# Patient Record
Sex: Male | Born: 1950 | Race: White | Hispanic: No | Marital: Married | Smoking: Never smoker
Health system: Southern US, Community
[De-identification: ages and names within clinical notes are randomized; demographics above are authoritative.]

## PROBLEM LIST (undated history)

## (undated) DIAGNOSIS — E7800 Pure hypercholesterolemia, unspecified: Secondary | ICD-10-CM

## (undated) DIAGNOSIS — R000 Tachycardia, unspecified: Secondary | ICD-10-CM

## (undated) DIAGNOSIS — C61 Malignant neoplasm of prostate: Secondary | ICD-10-CM

## (undated) DIAGNOSIS — C189 Malignant neoplasm of colon, unspecified: Secondary | ICD-10-CM

## (undated) DIAGNOSIS — I10 Essential (primary) hypertension: Secondary | ICD-10-CM

## (undated) HISTORY — PX: PROSTATE BIOPSY: SHX241

## (undated) HISTORY — PX: COLON RESECTION: SHX5231

---

## 1994-03-26 HISTORY — PX: BACK SURGERY: SHX140

## 2019-01-27 ENCOUNTER — Other Ambulatory Visit: Payer: Self-pay | Admitting: Urology

## 2019-01-27 DIAGNOSIS — C61 Malignant neoplasm of prostate: Secondary | ICD-10-CM

## 2019-02-11 ENCOUNTER — Encounter (HOSPITAL_COMMUNITY)
Admission: RE | Admit: 2019-02-11 | Discharge: 2019-02-11 | Disposition: A | Discharge status: Home / Self Care | Payer: Medicare Other | Source: Ambulatory Visit | Attending: Urology | Admitting: Urology

## 2019-02-11 ENCOUNTER — Other Ambulatory Visit: Payer: Self-pay

## 2019-02-11 DIAGNOSIS — C61 Malignant neoplasm of prostate: Secondary | ICD-10-CM | POA: Insufficient documentation

## 2019-02-11 MED ORDER — TECHNETIUM TC 99M MEDRONATE IV KIT
21.5000 | PACK | Freq: Once | INTRAVENOUS | Status: AC | PRN
  Administered 2019-02-11: 21.5 via INTRAVENOUS

## 2019-03-09 ENCOUNTER — Encounter: Payer: Self-pay | Admitting: *Deleted

## 2019-03-23 NOTE — Progress Notes (Signed)
GU Location of Tumor / Histology: prostatic adenocarcinoma  If Prostate Cancer, Gleason Score is (4 + 4) and PSA is (5.6). Prostate volume: 25.1 cc  Corey Carroll reports presenting to his PCP in May 2020. Reports an elevated PSA of 4.5 was discovered and he was referred to a urologist in [facility]. Patient wasn't pleased with the [facility] urologist and transferred his care to Dr. Junious Silk who did a prostate biopsy. Then, patient spoke with Dr. Alinda Money about surgical intervention.  Biopsies of prostate (if applicable) revealed:    Past/Anticipated interventions by urology, if any: prostate biopsy, CT abd/pelvis (negative), Bone scan (negative), referral for consideration of radiotherapy.  Patient leaning toward surgery. Patient with hx of colon ca. Patient had resection and no further treatment in 2003.   Past/Anticipated interventions by medical oncology, if any: no  Weight changes, if any: Denies. Reports consuming a plant based diet for years.   Bowel/Bladder complaints, if any: IPSS 2. SHIM 24 with medication. Denies dysuria,hematuria, urinary leakage or incontinence. Denies any bowel complaints.    Nausea/Vomiting, if any: denies  Pain issues, if any:  denies  SAFETY ISSUES:  Prior radiation? Denies radiation for treatment. Reports in 2004 he received dye in his system to examine his heart.  Pacemaker/ICD? denies  Possible current pregnancy? no, male patient  Is the patient on methotrexate? denies  Current Complaints / other details:  68 year old male. Married. No children.   Mother with history of breast ca. Father with hx of lung ca.

## 2019-03-24 ENCOUNTER — Other Ambulatory Visit: Payer: Self-pay

## 2019-03-24 ENCOUNTER — Ambulatory Visit
Admission: RE | Admit: 2019-03-24 | Discharge: 2019-03-24 | Disposition: A | Discharge status: Home / Self Care | Payer: Medicare Other | Source: Ambulatory Visit | Attending: Radiation Oncology | Admitting: Radiation Oncology

## 2019-03-24 ENCOUNTER — Encounter: Payer: Self-pay | Admitting: Radiation Oncology

## 2019-03-24 VITALS — Ht 69.0 in | Wt 138.0 lb

## 2019-03-24 DIAGNOSIS — C61 Malignant neoplasm of prostate: Secondary | ICD-10-CM

## 2019-03-24 HISTORY — DX: Tachycardia, unspecified: R00.0

## 2019-03-24 HISTORY — DX: Malignant neoplasm of prostate: C61

## 2019-03-24 HISTORY — DX: Malignant neoplasm of colon, unspecified: C18.9

## 2019-03-24 NOTE — Progress Notes (Signed)
See progress notes under physician encounter. 

## 2019-03-24 NOTE — Progress Notes (Signed)
Radiation Oncology         [phone] ________________________________  Initial outpatient Consultation - Conducted via Telephone due to current COVID-19 concerns for limiting patient exposure  Name: Corey Carroll MRN: [id number]  Date: 03/24/2019  DOB: 03-24-1951  [id number], Terisa Starr, MD   REFERRING PHYSICIAN: Raynelle Bring, MD  DIAGNOSIS: 68 y.o. gentleman with Stage T1c adenocarcinoma of the prostate with Gleason score of 4+4, and PSA of 5.87.    ICD-10-CM   1. Malignant neoplasm of prostate ([place])  C61     HISTORY OF PRESENT ILLNESS: Corey Carroll is a 68 y.o. male with a diagnosis of prostate cancer. He was noted to have an elevated PSA of 4.5 by his primary care provider, Shella Spearing, PA-C. Per patient, he was initially referred to a urologist in [facility] in 07/2018, but he opted to transfer his care elsewhere.  In the meantime, a repeat PSA performed by his PCP in 10/2018 had risen slightly to 5.6.  Accordingly, he was referred for evaluation in urology by Dr. Junious Silk on 12/26/2018,  digital rectal examination was performed at that time revealing no nodules. Repeat PSA performed at that time remained elevated at 5.87.  The patient proceeded to transrectal ultrasound with 12 biopsies of the prostate on 01/21/2019.  The prostate volume measured 25.1 cc.  Out of 12 core biopsies, 3 were positive.  The maximum Gleason score was 4+4, and this was seen in right apex and right lateral base. Gleason 3+4 was seen in right lateral apex.  He underwent CT A/P and bone scan for disease staging on 02/11/2019, both of which showed no evidence for metastatic disease.  The patient reviewed the biopsy results with his urologist and he has kindly been referred today for discussion of potential radiation treatment options. He also met with Dr. Alinda Money on 03/03/2019 to discuss surgical options.  Of note, he has a history of colon cancer, s/p resection in 2003.    PREVIOUS RADIATION THERAPY: No  PAST MEDICAL HISTORY:  Past Medical History:  Diagnosis Date   Colon cancer ([place])    Prostate cancer ([place])    Tachycardia       PAST SURGICAL HISTORY: Past Surgical History:  Procedure Laterality Date   BACK SURGERY  1996   removed herniated disc at L5   COLON RESECTION     PROSTATE BIOPSY      FAMILY HISTORY:  Family History  Problem Relation Age of Onset   Breast cancer Mother        tx by way of mastectomy   Lung cancer Father        smoker but stopped at 16 and died at 28 of lung ca.   Colon cancer Paternal Aunt    Colon cancer Paternal Aunt     SOCIAL HISTORY:  Social History   Socioeconomic History   Marital status: Married    Spouse name: Not on file   Number of children: 0   Years of education: Not on file   Highest education level: Not on file  Occupational History    Comment: retired  Tobacco Use   Smoking status: Never Smoker   Smokeless tobacco: Never Used  Substance and Sexual Activity   Alcohol use: Yes    Alcohol/week: 4.0 standard drinks    Types: 3 Glasses of wine, 1 Cans of beer per week    Comment: red wine with dinner   Drug use: Never   Sexual activity: Yes  Other  Topics Concern   Not on file  Social History Narrative   Not on file   Social Determinants of Health   Financial Resource Strain:    Difficulty of Paying Living Expenses: Not on file  Food Insecurity:    Worried About [place] in the Last Year: Not on file   Ran Out of Food in the Last Year: Not on file  Transportation Needs:    Lack of Transportation (Medical): Not on file   Lack of Transportation (Non-Medical): Not on file  Physical Activity:    Days of Exercise per Week: Not on file   Minutes of Exercise per Session: Not on file  Stress:    Feeling of Stress : Not on file  Social Connections:    Frequency of Communication with Friends and Family: Not on file   Frequency of Social  Gatherings with Friends and Family: Not on file   Attends Religious Services: Not on file   Active Member of Clubs or Organizations: Not on file   Attends Archivist Meetings: Not on file   Marital Status: Not on file  Intimate Partner Violence:    Fear of Current or Ex-Partner: Not on file   Emotionally Abused: Not on file   Physically Abused: Not on file   Sexually Abused: Not on file    ALLERGIES: Patient has no known allergies.  MEDICATIONS:  Current Outpatient Medications  Medication Sig Dispense Refill   carvedilol (COREG) 6.25 MG tablet SMARTSIG:1 Tablet(s) By Mouth As Needed     Coenzyme Q10 (COQ10) 100 MG CAPS Take by mouth.     lisinopril (ZESTRIL) 5 MG tablet Take 5 mg by mouth daily.     Melatonin 3 MG TABS Take by mouth.     Multiple Vitamins-Minerals (CENTRUM ADULTS PO) Take by mouth.     simvastatin (ZOCOR) 20 MG tablet Take 20 mg by mouth daily. Every other day     sildenafil (REVATIO) 20 MG tablet SMARTSIG:1 Tablet(s) By Mouth As Needed     No current facility-administered medications for this encounter.    REVIEW OF SYSTEMS:  On review of systems, the patient reports that he is doing well overall. He denies any chest pain, shortness of breath, cough, fevers, chills, night sweats, unintended weight changes. He denies any bowel disturbances, and denies abdominal pain, nausea or vomiting. He denies any new musculoskeletal or joint aches or pains. His IPSS was 2, indicating very mild urinary symptoms. His SHIM was 24 with medication, indicating he has controlled erectile dysfunction. A complete review of systems is obtained and is otherwise negative.  PHYSICAL EXAM:  Wt Readings from Last 3 Encounters:  03/24/19 138 lb (62.6 kg)   Temp Readings from Last 3 Encounters:  No data found for Temp   BP Readings from Last 3 Encounters:  No data found for BP   Pulse Readings from Last 3 Encounters:  No data found for Pulse   Pain  Assessment Pain Score: 0-No pain/10  Physical exam not performed in light of telephone consult visit format.    KPS = 100  100 - Normal; no complaints; no evidence of disease. 90   - Able to carry on normal activity; minor signs or symptoms of disease. 80   - Normal activity with effort; some signs or symptoms of disease. 37   - Cares for self; unable to carry on normal activity or to do active work. 60   - Requires occasional assistance, but is  able to care for most of his personal needs. 50   - Requires considerable assistance and frequent medical care. 42   - Disabled; requires special care and assistance. 5   - Severely disabled; hospital admission is indicated although death not imminent. 35   - Very sick; hospital admission necessary; active supportive treatment necessary. 10   - Moribund; fatal processes progressing rapidly. 0     - Dead  Karnofsky DA, Abelmann WH, Craver LS and Burchenal JH [phone]) The use of the nitrogen mustards in the palliative treatment of carcinoma: with particular reference to bronchogenic carcinoma Cancer 1 634-56  LABORATORY DATA:  No results found for: WBC, HGB, HCT, MCV, PLT No results found for: NA, K, CL, CO2 No results found for: ALT, AST, GGT, ALKPHOS, BILITOT   RADIOGRAPHY: No results found.    IMPRESSION/PLAN: This visit was conducted via Telephone to spare the patient unnecessary potential exposure in the healthcare setting during the current COVID-19 pandemic. 1. 68 y.o. gentleman with Stage T1c adenocarcinoma of the prostate with Gleason Score of 4+4, and PSA of 5.87. We discussed the patient's workup and outlined the nature of prostate cancer in this setting. The patient's T stage, Gleason's score, and PSA put him into the high risk group. Accordingly, he is eligible for a variety of potential treatment options including LT-ADT concurrent with either 8 weeks of external radiation or 5 weeks of external radiation followed by a brachytherapy  boost. We discussed the available radiation techniques, and focused on the details and logistics of delivery. We discussed and outlined the risks, benefits, short and long-term effects associated with radiotherapy and compared and contrasted these with prostatectomy. We discussed the role of SpaceOAR in reducing the rectal toxicity associated with radiotherapy. We also detailed the role of ADT in the treatment of high risk prostate cancer and outlined the associated side effects that could be expected with this therapy. We explained the rationale behind intentionally delaying the start of radiation for approximately 2 months after starting ADT to allow for the radiosensitizing effects of this therapy.  He and his wife were encouraged to ask questions which were answered to their stated satisfaction.  At the end of the conversation the patient elects to proceed with LT-ADT concurrent with a brachytherapy seed boost with SpaceOAR gel placement followed by 5 weeks of external beam radiotherapy. We will share our discussion with Dr. Junious Silk and Dr. Alinda Money and make arrangements for a follow-up visit to start ADT, first available in anticipation of proceeding with the seed boost procedure in March 2021, approximately 2 months after starting ADT. The patient will be contacted by Romie Jumper in our office who will be working closely with him to coordinate OR scheduling and pre and post procedure appointments.  We will contact the pharmaceutical rep to ensure that [place] is available at the time of procedure.  He will have a prostate MRI following his post-seed CT SIM to confirm appropriate distribution of the SpaceOAR and we will begin treatment planning for the external beam radiotherapy at that time, in anticipation of beginning the daily radiation treatments approximately 3 weeks after his seed boost procedure.  He appears to have a good understanding of his disease and our treatment recommendations which are of  curative intent and is comfortable and in agreement with the stated plan.  We will move forward with treatment planning accordingly..  Given current concerns for patient exposure during the COVID-19 pandemic, this encounter was conducted via telephone. The patient was  notified in advance and was offered a MyChart meeting to allow for face to face communication but unfortunately reported that he did not have the appropriate resources/technology to support such a visit and instead preferred to proceed with telephone consult. The patient has given verbal consent for this type of encounter. The time spent during this encounter was 60 minutes. The attendants for this meeting include Tyler Pita MD, Ashlyn Bruning PA-C, [place], patient, Kelsen Celona and his wife. During the encounter, Tyler Pita MD, Ashlyn Bruning PA-C, and scribe, Wilburn Mylar were located at [place].  Patient, Zanden Colver and his wife were located at home.    Nicholos Johns, PA-C    Tyler Pita, MD  [facility] Direct Dial: [phone]   Fax: [phone] [email or web address]   Skype   [email or web address]   This document serves as a record of services personally performed by Tyler Pita, MD and Freeman Caldron, PA-C. It was created on their behalf by Wilburn Mylar, a trained medical scribe. The creation of this record is based on the scribe's personal observations and the provider's statements to them. This document has been checked and approved by the attending provider.

## 2019-03-25 DIAGNOSIS — C61 Malignant neoplasm of prostate: Secondary | ICD-10-CM | POA: Insufficient documentation

## 2019-03-31 ENCOUNTER — Telehealth: Payer: Self-pay | Admitting: *Deleted

## 2019-03-31 NOTE — Telephone Encounter (Signed)
CALLED PATIENT TO INFORM OF ADT APPT. FOR 04-27-19 - ARRIVAL TIME- 1:45 PM @ DR. Lyndal Rainbow OFFICE, SPOKE WITH PATIENT AND HE IS AWARE OF THIS APPT.

## 2019-04-10 ENCOUNTER — Telehealth: Payer: Self-pay | Admitting: *Deleted

## 2019-04-10 NOTE — Telephone Encounter (Signed)
Called patient to inform of ADT appt. for 04-13-19 - arrival time- 10:15 am @ Dr. Lyndal Rainbow Office, spoke with patient and he is aware of this appt.

## 2019-04-16 ENCOUNTER — Other Ambulatory Visit: Payer: Self-pay | Admitting: Urology

## 2019-04-22 ENCOUNTER — Telehealth: Payer: Self-pay | Admitting: *Deleted

## 2019-04-22 NOTE — Telephone Encounter (Signed)
Called patient to inform of pre-seed planning CT and implant , spoke with patient and he is aware of these appts.

## 2019-06-01 ENCOUNTER — Telehealth: Payer: Self-pay | Admitting: *Deleted

## 2019-06-01 NOTE — Telephone Encounter (Signed)
RETURNED PATIENT'S PHONE CALL, LVM FOR A RETURN CALL 

## 2019-06-01 NOTE — Progress Notes (Signed)
  Radiation Oncology         [phone] ________________________________  Name: Corey Carroll MRN: [id number]  Date: 06/04/2019  DOB: [birth year]  SIMULATION AND TREATMENT PLANNING NOTE PUBIC ARCH STUDY  [name], Terisa Starr, MD  DIAGNOSIS: 69 y.o. gentleman with Stage T1c adenocarcinoma of the prostate with Gleason score of 4+4, and PSA of 5.87     ICD-10-CM   1. Malignant neoplasm of prostate ([place])  C61     COMPLEX SIMULATION:  The patient presented today for evaluation for possible prostate seed implant. He was brought to the radiation planning suite and placed supine on the CT couch. A 3-dimensional image study set was obtained in upload to the planning computer. There, on each axial slice, I contoured the prostate gland. Then, using three-dimensional radiation planning tools I reconstructed the prostate in view of the structures from the transperineal needle pathway to assess for possible pubic arch interference. In doing so, I did not appreciate any pubic arch interference. Also, the patient's prostate volume was estimated based on the drawn structure. The volume was 25 cc.  Given the pubic arch appearance and prostate volume, patient remains a good candidate to proceed with prostate seed implant. Today, he freely provided informed written consent to proceed.    PLAN: The patient will undergo prostate seed implant boost to be followed by external radiotherapy.   ________________________________  Sheral Apley. Tammi Klippel, M.D.

## 2019-06-03 ENCOUNTER — Telehealth: Payer: Self-pay | Admitting: *Deleted

## 2019-06-03 NOTE — Telephone Encounter (Signed)
CALLED PATIENT TO REMIND OF PRE-SEED APPTS. FOR 06-04-19, LVM FOR A RETURN CALL

## 2019-06-04 ENCOUNTER — Other Ambulatory Visit: Payer: Self-pay

## 2019-06-04 ENCOUNTER — Ambulatory Visit
Admission: RE | Admit: 2019-06-04 | Discharge: 2019-06-04 | Disposition: A | Discharge status: Home / Self Care | Payer: Medicare Other | Source: Ambulatory Visit | Attending: Radiation Oncology | Admitting: Radiation Oncology

## 2019-06-04 ENCOUNTER — Encounter (HOSPITAL_COMMUNITY)
Admission: RE | Admit: 2019-06-04 | Discharge: 2019-06-04 | Disposition: A | Discharge status: Home / Self Care | Payer: Medicare Other | Source: Ambulatory Visit | Attending: Urology | Admitting: Urology

## 2019-06-04 ENCOUNTER — Encounter (HOSPITAL_COMMUNITY): Payer: Medicare Other

## 2019-06-04 ENCOUNTER — Ambulatory Visit
Admission: RE | Admit: 2019-06-04 | Discharge: 2019-06-04 | Disposition: A | Discharge status: Home / Self Care | Payer: Medicare Other | Source: Ambulatory Visit | Attending: Urology | Admitting: Urology

## 2019-06-04 ENCOUNTER — Ambulatory Visit (HOSPITAL_COMMUNITY)
Admission: RE | Admit: 2019-06-04 | Discharge: 2019-06-04 | Disposition: A | Discharge status: Home / Self Care | Payer: Medicare Other | Source: Ambulatory Visit | Attending: Urology | Admitting: Urology

## 2019-06-04 ENCOUNTER — Other Ambulatory Visit: Payer: Self-pay | Admitting: Urology

## 2019-06-04 DIAGNOSIS — C61 Malignant neoplasm of prostate: Secondary | ICD-10-CM | POA: Diagnosis present

## 2019-06-25 ENCOUNTER — Encounter (HOSPITAL_BASED_OUTPATIENT_CLINIC_OR_DEPARTMENT_OTHER): Payer: Self-pay | Admitting: Urology

## 2019-06-25 ENCOUNTER — Other Ambulatory Visit: Payer: Self-pay

## 2019-06-25 ENCOUNTER — Telehealth: Payer: Self-pay | Admitting: *Deleted

## 2019-06-25 NOTE — Telephone Encounter (Signed)
CALLED PATIENT TO REMIND OF LAB AND COVID TESTING FOR 06-30-19, SPOKE WITH PATIENT AND HE IS AWARE OF THESE APPTS.

## 2019-06-25 NOTE — Progress Notes (Signed)
Spoke with patient via telephone for pre op interview. NPO after MN. No medications am of surgery. Patient verbalized understanding of using FLEETS enema AM of surgery. Arrival time 0830.

## 2019-06-30 ENCOUNTER — Encounter (HOSPITAL_COMMUNITY)
Admission: RE | Admit: 2019-06-30 | Discharge: 2019-06-30 | Disposition: A | Discharge status: Home / Self Care | Payer: Medicare Other | Source: Ambulatory Visit | Attending: Urology | Admitting: Urology

## 2019-06-30 ENCOUNTER — Other Ambulatory Visit (HOSPITAL_COMMUNITY)
Admission: RE | Admit: 2019-06-30 | Discharge: 2019-06-30 | Disposition: A | Discharge status: Home / Self Care | Payer: Medicare Other | Source: Ambulatory Visit | Attending: Urology | Admitting: Urology

## 2019-06-30 ENCOUNTER — Other Ambulatory Visit: Payer: Self-pay

## 2019-06-30 DIAGNOSIS — E7800 Pure hypercholesterolemia, unspecified: Secondary | ICD-10-CM | POA: Diagnosis not present

## 2019-06-30 DIAGNOSIS — Z85038 Personal history of other malignant neoplasm of large intestine: Secondary | ICD-10-CM | POA: Diagnosis not present

## 2019-06-30 DIAGNOSIS — Z01812 Encounter for preprocedural laboratory examination: Secondary | ICD-10-CM | POA: Diagnosis present

## 2019-06-30 DIAGNOSIS — C61 Malignant neoplasm of prostate: Secondary | ICD-10-CM | POA: Diagnosis not present

## 2019-06-30 DIAGNOSIS — I10 Essential (primary) hypertension: Secondary | ICD-10-CM | POA: Diagnosis not present

## 2019-06-30 DIAGNOSIS — Z79899 Other long term (current) drug therapy: Secondary | ICD-10-CM | POA: Diagnosis not present

## 2019-06-30 DIAGNOSIS — Z20822 Contact with and (suspected) exposure to covid-19: Secondary | ICD-10-CM | POA: Diagnosis not present

## 2019-06-30 LAB — COMPREHENSIVE METABOLIC PANEL
ALT: 28 U/L (ref 0–44)
AST: 28 U/L (ref 15–41)
Albumin: 4.2 g/dL (ref 3.5–5.0)
Alkaline Phosphatase: 48 U/L (ref 38–126)
Anion gap: 7 (ref 5–15)
BUN: 17 mg/dL (ref 8–23)
CO2: 31 mmol/L (ref 22–32)
Calcium: 9.8 mg/dL (ref 8.9–10.3)
Chloride: 105 mmol/L (ref 98–111)
Creatinine, Ser: 0.71 mg/dL (ref 0.61–1.24)
GFR calc Af Amer: >60 mL/min (ref >60)
GFR calc non Af Amer: >60 mL/min (ref >60)
Glucose, Bld: 97 mg/dL (ref 70–99)
Potassium: 4.7 mmol/L (ref 3.5–5.1)
Sodium: 143 mmol/L (ref 135–145)
Total Bilirubin: 0.6 mg/dL (ref 0.3–1.2)
Total Protein: 7.6 g/dL (ref 6.5–8.1)

## 2019-06-30 LAB — PROTIME-INR
INR: 0.9 (ref 0.8–1.2)
Prothrombin Time: 12.2 seconds (ref 11.4–15.2)

## 2019-06-30 LAB — CBC
HCT: 41.5 % (ref 39.0–52.0)
Hemoglobin: 13.6 g/dL (ref 13.0–17.0)
MCH: 31.6 pg (ref 26.0–34.0)
MCHC: 32.8 g/dL (ref 30.0–36.0)
MCV: 96.3 fL (ref 80.0–100.0)
Platelets: 179 10*3/uL (ref 150–400)
RBC: 4.31 MIL/uL (ref 4.22–5.81)
RDW: 11.8 % (ref 11.5–15.5)
WBC: 4.1 10*3/uL (ref 4.0–10.5)
nRBC: 0 % (ref 0.0–0.2)

## 2019-06-30 LAB — SARS CORONAVIRUS 2 (TAT 6-24 HRS): SARS Coronavirus 2: NEGATIVE

## 2019-06-30 LAB — APTT: aPTT: 31 seconds (ref 24–36)

## 2019-07-02 ENCOUNTER — Telehealth: Payer: Self-pay | Admitting: *Deleted

## 2019-07-02 NOTE — Telephone Encounter (Signed)
CALLED PATIENT TO REMIND OF IMPLANT FOR 07-03-19, SPOKE WITH PATIENT AND HE IS AWARE OF THIS PROCEDURE

## 2019-07-03 ENCOUNTER — Ambulatory Visit (HOSPITAL_BASED_OUTPATIENT_CLINIC_OR_DEPARTMENT_OTHER)
Admission: RE | Admit: 2019-07-03 | Discharge: 2019-07-03 | Disposition: A | Discharge status: Home / Self Care | Payer: Medicare Other | Attending: Urology | Admitting: Urology

## 2019-07-03 ENCOUNTER — Ambulatory Visit (HOSPITAL_COMMUNITY): Payer: Medicare Other

## 2019-07-03 ENCOUNTER — Ambulatory Visit (HOSPITAL_BASED_OUTPATIENT_CLINIC_OR_DEPARTMENT_OTHER): Payer: Medicare Other | Admitting: Anesthesiology

## 2019-07-03 ENCOUNTER — Encounter (HOSPITAL_BASED_OUTPATIENT_CLINIC_OR_DEPARTMENT_OTHER): Payer: Self-pay | Admitting: Urology

## 2019-07-03 ENCOUNTER — Ambulatory Visit (HOSPITAL_BASED_OUTPATIENT_CLINIC_OR_DEPARTMENT_OTHER): Payer: Medicare Other | Admitting: Physician Assistant

## 2019-07-03 ENCOUNTER — Other Ambulatory Visit: Payer: Self-pay

## 2019-07-03 ENCOUNTER — Encounter (HOSPITAL_BASED_OUTPATIENT_CLINIC_OR_DEPARTMENT_OTHER)
Admission: RE | Disposition: A | Discharge status: Home / Self Care | Payer: Self-pay | Source: Home / Self Care | Attending: Urology

## 2019-07-03 DIAGNOSIS — Z79899 Other long term (current) drug therapy: Secondary | ICD-10-CM | POA: Diagnosis not present

## 2019-07-03 DIAGNOSIS — I10 Essential (primary) hypertension: Secondary | ICD-10-CM | POA: Diagnosis not present

## 2019-07-03 DIAGNOSIS — E7800 Pure hypercholesterolemia, unspecified: Secondary | ICD-10-CM | POA: Insufficient documentation

## 2019-07-03 DIAGNOSIS — Z85038 Personal history of other malignant neoplasm of large intestine: Secondary | ICD-10-CM | POA: Insufficient documentation

## 2019-07-03 DIAGNOSIS — C61 Malignant neoplasm of prostate: Secondary | ICD-10-CM | POA: Insufficient documentation

## 2019-07-03 HISTORY — PX: RADIOACTIVE SEED IMPLANT: SHX5150

## 2019-07-03 HISTORY — PX: CYSTOSCOPY: SHX5120

## 2019-07-03 HISTORY — DX: Pure hypercholesterolemia, unspecified: E78.00

## 2019-07-03 HISTORY — PX: SPACE OAR INSTILLATION: SHX6769

## 2019-07-03 HISTORY — DX: Essential (primary) hypertension: I10

## 2019-07-03 SURGERY — INSERTION, RADIATION SOURCE, PROSTATE
Anesthesia: General | Site: Rectum

## 2019-07-03 MED ORDER — OXYCODONE HCL 5 MG/5ML PO SOLN
5.0000 mg | Freq: Once | ORAL | Status: DC | PRN
  Filled 2019-07-03: qty 5

## 2019-07-03 MED ORDER — CIPROFLOXACIN IN D5W 400 MG/200ML IV SOLN
INTRAVENOUS | Status: AC
  Filled 2019-07-03: qty 200

## 2019-07-03 MED ORDER — FLEET ENEMA 7-19 GM/118ML RE ENEM
1.0000 | ENEMA | Freq: Once | RECTAL | Status: DC
  Filled 2019-07-03: qty 1

## 2019-07-03 MED ORDER — MIDAZOLAM HCL 2 MG/2ML IJ SOLN
INTRAMUSCULAR | Status: DC | PRN
  Administered 2019-07-03: 2 mg via INTRAVENOUS

## 2019-07-03 MED ORDER — FENTANYL CITRATE (PF) 100 MCG/2ML IJ SOLN
INTRAMUSCULAR | Status: AC
  Filled 2019-07-03: qty 2

## 2019-07-03 MED ORDER — IOHEXOL 300 MG/ML  SOLN
INTRAMUSCULAR | Status: DC | PRN
  Administered 2019-07-03: 7 mL

## 2019-07-03 MED ORDER — LIDOCAINE 2% (20 MG/ML) 5 ML SYRINGE
INTRAMUSCULAR | Status: AC
  Filled 2019-07-03: qty 5

## 2019-07-03 MED ORDER — OXYCODONE HCL 5 MG PO TABS
5.0000 mg | ORAL_TABLET | Freq: Once | ORAL | Status: DC | PRN
  Filled 2019-07-03: qty 1

## 2019-07-03 MED ORDER — MIDAZOLAM HCL 2 MG/2ML IJ SOLN
INTRAMUSCULAR | Status: AC
  Filled 2019-07-03: qty 2

## 2019-07-03 MED ORDER — LIDOCAINE 2% (20 MG/ML) 5 ML SYRINGE
INTRAMUSCULAR | Status: DC | PRN
  Administered 2019-07-03: 100 mg via INTRAVENOUS

## 2019-07-03 MED ORDER — FENTANYL CITRATE (PF) 100 MCG/2ML IJ SOLN
25.0000 ug | INTRAMUSCULAR | Status: DC | PRN
  Administered 2019-07-03 (×2): 25 ug via INTRAVENOUS
  Filled 2019-07-03: qty 1

## 2019-07-03 MED ORDER — LACTATED RINGERS IV SOLN
INTRAVENOUS | Status: DC
  Administered 2019-07-03: 50 mL/h via INTRAVENOUS
  Filled 2019-07-03: qty 1000

## 2019-07-03 MED ORDER — DEXAMETHASONE SODIUM PHOSPHATE 10 MG/ML IJ SOLN
INTRAMUSCULAR | Status: DC | PRN
  Administered 2019-07-03: 5 mg via INTRAVENOUS

## 2019-07-03 MED ORDER — PHENYLEPHRINE 40 MCG/ML (10ML) SYRINGE FOR IV PUSH (FOR BLOOD PRESSURE SUPPORT)
PREFILLED_SYRINGE | INTRAVENOUS | Status: DC | PRN
  Administered 2019-07-03 (×4): 40 ug via INTRAVENOUS

## 2019-07-03 MED ORDER — CIPROFLOXACIN IN D5W 400 MG/200ML IV SOLN
400.0000 mg | INTRAVENOUS | Status: AC
  Administered 2019-07-03: 400 mg via INTRAVENOUS
  Filled 2019-07-03: qty 200

## 2019-07-03 MED ORDER — SODIUM CHLORIDE 0.9 % IR SOLN
Status: DC | PRN
  Administered 2019-07-03: 1000 mL via INTRAVESICAL

## 2019-07-03 MED ORDER — PROPOFOL 10 MG/ML IV BOLUS
INTRAVENOUS | Status: AC
  Filled 2019-07-03: qty 40

## 2019-07-03 MED ORDER — KETOROLAC TROMETHAMINE 30 MG/ML IJ SOLN
30.0000 mg | Freq: Once | INTRAMUSCULAR | Status: DC | PRN
  Filled 2019-07-03: qty 1

## 2019-07-03 MED ORDER — FENTANYL CITRATE (PF) 100 MCG/2ML IJ SOLN
INTRAMUSCULAR | Status: DC | PRN
  Administered 2019-07-03: 25 ug via INTRAVENOUS
  Administered 2019-07-03: 50 ug via INTRAVENOUS

## 2019-07-03 MED ORDER — SODIUM CHLORIDE FLUSH 0.9 % IV SOLN
INTRAVENOUS | Status: DC | PRN
  Administered 2019-07-03: 10 mL via INTRAVENOUS

## 2019-07-03 MED ORDER — ONDANSETRON HCL 4 MG/2ML IJ SOLN
INTRAMUSCULAR | Status: DC | PRN
  Administered 2019-07-03: 4 mg via INTRAVENOUS

## 2019-07-03 MED ORDER — PROPOFOL 10 MG/ML IV BOLUS
INTRAVENOUS | Status: DC | PRN
  Administered 2019-07-03: 150 mg via INTRAVENOUS

## 2019-07-03 SURGICAL SUPPLY — 41 items
BAG URINE DRAIN 2000ML AR STRL (UROLOGICAL SUPPLIES) ×5 IMPLANT
BLADE CLIPPER SENSICLIP SURGIC (BLADE) ×5 IMPLANT
CATH FOLEY 2WAY SLVR  5CC 16FR (CATHETERS) ×2
CATH FOLEY 2WAY SLVR 5CC 16FR (CATHETERS) ×3 IMPLANT
CATH ROBINSON RED A/P 16FR (CATHETERS) IMPLANT
CATH ROBINSON RED A/P 20FR (CATHETERS) ×5 IMPLANT
CLOTH BEACON ORANGE TIMEOUT ST (SAFETY) ×5 IMPLANT
CNTNR URN SCR LID CUP LEK RST (MISCELLANEOUS) ×6 IMPLANT
CONT SPEC 4OZ STRL OR WHT (MISCELLANEOUS) ×4
COVER BACK TABLE 60X90IN (DRAPES) ×5 IMPLANT
COVER MAYO STAND STRL (DRAPES) ×5 IMPLANT
DRSG TEGADERM 4X4.75 (GAUZE/BANDAGES/DRESSINGS) ×5 IMPLANT
DRSG TEGADERM 8X12 (GAUZE/BANDAGES/DRESSINGS) ×5 IMPLANT
GAUZE SPONGE 4X4 12PLY STRL (GAUZE/BANDAGES/DRESSINGS) ×5 IMPLANT
GLOVE BIO SURGEON STRL SZ7.5 (GLOVE) ×5 IMPLANT
GLOVE BIO SURGEON STRL SZ8 (GLOVE) IMPLANT
GLOVE BIOGEL PI IND STRL 6.5 (GLOVE) ×6 IMPLANT
GLOVE BIOGEL PI IND STRL 7.5 (GLOVE) ×3 IMPLANT
GLOVE BIOGEL PI INDICATOR 6.5 (GLOVE) ×4
GLOVE BIOGEL PI INDICATOR 7.5 (GLOVE) ×2
GLOVE SURG ORTHO 8.5 STRL (GLOVE) ×5 IMPLANT
GLOVE SURG SS PI 6.5 STRL IVOR (GLOVE) IMPLANT
GOWN STRL REUS W/ TWL XL LVL3 (GOWN DISPOSABLE) ×3 IMPLANT
GOWN STRL REUS W/TWL XL LVL3 (GOWN DISPOSABLE) ×7 IMPLANT
HOLDER FOLEY CATH W/STRAP (MISCELLANEOUS) ×5 IMPLANT
I-SEED AGX100 ×5 IMPLANT
IMPL SPACEOAR SYSTEM 10ML (Spacer) ×3 IMPLANT
IMPLANT SPACEOAR SYSTEM 10ML (Spacer) ×5 IMPLANT
IV NS 1000ML (IV SOLUTION) ×2
IV NS 1000ML BAXH (IV SOLUTION) ×3 IMPLANT
KIT TURNOVER CYSTO (KITS) ×5 IMPLANT
MARKER SKIN DUAL TIP RULER LAB (MISCELLANEOUS) ×5 IMPLANT
PACK CYSTO (CUSTOM PROCEDURE TRAY) ×5 IMPLANT
SURGILUBE 2OZ TUBE FLIPTOP (MISCELLANEOUS) ×5 IMPLANT
SUT BONE WAX W31G (SUTURE) IMPLANT
SYR 10ML LL (SYRINGE) ×5 IMPLANT
TOWEL OR 17X26 10 PK STRL BLUE (TOWEL DISPOSABLE) ×5 IMPLANT
TUBE CONNECTING 12'X1/4 (SUCTIONS)
TUBE CONNECTING 12X1/4 (SUCTIONS) IMPLANT
UNDERPAD 30X30 (UNDERPADS AND DIAPERS) ×10 IMPLANT
WATER STERILE IRR 500ML POUR (IV SOLUTION) ×5 IMPLANT

## 2019-07-03 NOTE — Anesthesia Preprocedure Evaluation (Addendum)
Anesthesia Evaluation  Patient identified by MRN, date of birth, ID band Patient awake    Reviewed: Allergy & Precautions, NPO status , Patient's Chart, lab work & pertinent test results  Airway Mallampati: II  TM Distance: >3 FB Neck ROM: Full    Dental no notable dental hx.    Pulmonary neg pulmonary ROS,    Pulmonary exam normal breath sounds clear to auscultation       Cardiovascular hypertension, Pt. on medications Normal cardiovascular exam Rhythm:Regular Rate:Normal     Neuro/Psych negative neurological ROS  negative psych ROS   GI/Hepatic negative GI ROS, Neg liver ROS,   Endo/Other  negative endocrine ROS  Renal/GU negative Renal ROS  negative genitourinary   Musculoskeletal negative musculoskeletal ROS (+)   Abdominal   Peds negative pediatric ROS (+)  Hematology negative hematology ROS (+)   Anesthesia Other Findings   Reproductive/Obstetrics negative OB ROS                            Anesthesia Physical Anesthesia Plan  ASA: II  Anesthesia Plan: General   Post-op Pain Management:    Induction: Intravenous  PONV Risk Score and Plan: 2 and Ondansetron, Dexamethasone and Treatment may vary due to age or medical condition  Airway Management Planned: LMA  Additional Equipment:   Intra-op Plan:   Post-operative Plan: Extubation in OR  Informed Consent: I have reviewed the patients History and Physical, chart, labs and discussed the procedure including the risks, benefits and alternatives for the proposed anesthesia with the patient or authorized representative who has indicated his/her understanding and acceptance.     Dental advisory given  Plan Discussed with: CRNA and Surgeon  Anesthesia Plan Comments:         Anesthesia Quick Evaluation

## 2019-07-03 NOTE — Discharge Instructions (Signed)
Brachytherapy for Prostate Cancer, Care After ° °This sheet gives you information about how to care for yourself after your procedure. Your health care provider may also give you more specific instructions. If you have problems or questions, contact your health care provider. °What can I expect after the procedure? °After the procedure, it is common to have: °· Trouble passing urine. °· Blood in the urine or semen. °· Constipation. °· Frequent feeling of an urgent need to urinate. °· Bruising, swelling, and tenderness of the area behind the scrotum (perineum). °· Bloating and gas. °· Fatigue. °· Burning or pain in the rectum. °· Problems getting or keeping an erection (erectile dysfunction). °· Nausea. °Follow these instructions at home: °Managing pain, stiffness, and swelling °· If directed, apply ice to the affected area: °? Put ice in a plastic bag. °? Place a towel between your skin and the bag. °? Leave the ice on for 20 minutes, 2-3 times a day. °· Try not to sit directly on the area behind the scrotum. A soft cushion can help with discomfort. °Activity °· Do not drive for 24 hours if you were given a medicine to help you relax (sedative). °· Do not drive or use heavy machinery while taking prescription pain medicine. °· Rest as told by your health care provider. °· Most people can return to normal activities a few days or weeks after the procedure. Ask your health care provider what activities are safe for you. °Eating and drinking °· Drink enough fluid to keep your urine clear or pale yellow. °· Eat a healthy, balanced diet. This includes lean proteins, whole grains, and plenty of fruits and vegetables. °General instructions °· Take over-the-counter and prescription medicines only as told by your health care provider. °· Keep all follow-up visits as told by your health care provider. This is important. You may still need additional treatment. °· Do not take baths, swim, or use a hot tub until your health  care provider approves. Shower and wash the area behind the scrotum gently. °· Do not have sex for one week after the treatment, or until your health care provider approves. °· If you have permanent, low-dose brachytherapy implants: °? Limit close contact with children and pregnant women for 2 months or as told by your health care provider. This is important because of the radiation that is still active in the prostate. °? You may set off radioactive sensors, such as airport screenings. Ask your health care provider for a document that explains your treatment. °? You may be instructed to use a condom during sex for the first 2 months after low-dose brachytherapy. °Contact a health care provider if: °· You have a fever or chills. °· You do not have a bowel movement for 3-4 days after the procedure. °· You have diarrhea for 3-4 days after the procedure. °· You develop any new symptoms, such as problems with urinating or erectile dysfunction. °· You have abdomen (abdominal) pain. °· You have more blood in your urine. °Get help right away if: °· You cannot urinate. °· There is excessive bleeding from your rectum. °· You have unusual drainage coming from your rectum. °· You have severe pain in the treated area that does not go away with pain medicine. °· You have severe nausea or vomiting. °Summary °· If you have permanent, low-dose brachytherapy implants, limit close contact with children and pregnant women for 2 months or as told by your health care provider. This is important because of the radiation   that is still active in the prostate. °· Talk with your health care provider about your risk of brachytherapy side effects, such as erectile dysfunction or urinary problems. Your health care provider will be able to recommend possible treatment options. °· Keep all follow-up visits as told by your health care provider. This is important. You may need additional treatment. °This information is not intended to replace  advice given to you by your health care provider. Make sure you discuss any questions you have with your health care provider. °Document Revised: 02/22/2017 Document Reviewed: 04/13/2016 °Elsevier Patient Education © 2020 [employer] ° ° °Post Anesthesia Home Care Instructions ° °Activity: °Get plenty of rest for the remainder of the day. A responsible individual must stay with you for 24 hours following the procedure.  °For the next 24 hours, DO NOT: °-Drive a car °-Operate machinery °-Drink alcoholic beverages °-Take any medication unless instructed by your physician °-Make any legal decisions or sign important papers. ° °Meals: °Start with liquid foods such as gelatin or soup. Progress to regular foods as tolerated. Avoid greasy, spicy, heavy foods. If nausea and/or vomiting occur, drink only clear liquids until the nausea and/or vomiting subsides. Call your physician if vomiting continues. ° °Special Instructions/Symptoms: °Your throat may feel dry or sore from the anesthesia or the breathing tube placed in your throat during surgery. If this causes discomfort, gargle with warm salt water. The discomfort should disappear within 24 hours. °   ° ° ° °

## 2019-07-03 NOTE — Anesthesia Postprocedure Evaluation (Signed)
Anesthesia Post Note  Patient: Corey Carroll  Procedure(s) Performed: RADIOACTIVE SEED IMPLANT/BRACHYTHERAPY IMPLANT (N/A Prostate) SPACE OAR INSTILLATION (N/A Rectum) CYSTOSCOPY FLEXIBLE (Bladder)     Patient location during evaluation: PACU Anesthesia Type: General Level of consciousness: awake and alert Pain management: pain level controlled Vital Signs Assessment: post-procedure vital signs reviewed and stable Respiratory status: spontaneous breathing, nonlabored ventilation, respiratory function stable and patient connected to nasal cannula oxygen Cardiovascular status: blood pressure returned to baseline and stable Postop Assessment: no apparent nausea or vomiting Anesthetic complications: no    Last Vitals:  Vitals:   07/03/19 1230 07/03/19 1245  BP: 134/76 133/72  Pulse: 88 90  Resp: 12 14  Temp:  36.9 C  SpO2: 100% 99%    Last Pain:  Vitals:   07/03/19 1245  TempSrc:   PainSc: 2                  Missi Mcmackin S

## 2019-07-03 NOTE — Op Note (Addendum)
Preoperative diagnosis: Stage I (T1cNxMx) Prostate cancer Postoperative diagnosis: Same  Procedure: Prostate brachytherapy seed implant, Cystoscopy, SpaceOar biodegradable gel insertion  Surgeon: Junious Silk  Radiation oncologist: Tammi Klippel  Anesthesia: Gen.  Indication for procedure: 69 year old with stage IIC (T1cNxMx, GG 4) prostate cancer who elected to proceed with prostate brachytherapy and spaceoar biodegradable gel insertion  Findings: On fluoroscopic imaging there was adequate coverage of the prostate. On cystoscopy the urethra appeared normal, the prostatic urethra appeared normal, the trigone and ureteral orifices appeared normal with clear efflux. The bladder mucosa appeared normal. There were no stones, foreign bodies or seeds visualized in the bladder.  Dose:110 Gy with 25 catheters and 62 active sources   Description of procedure: After consent was obtained patient brought to the operating room. After adequate anesthesia he is placed in lithotomy position and the transrectal ultrasound probe and perineal template positioned. Catheters and brachytherapy seeds were placed per Dr. Johny Shears plan. A total of 25 catheters and 62 active sources (I-125) were placed. The anchoring needles, template and ultrasound were removed. Scout flouro imaging was obtained of the implant.   The 18-gauge needle was then inserted approximately 1 to 2 cm anterior to the anal opening and directed under fluoroscopic guidance into the perirectal fat between the anterior rectal wall and the prostate capsule down to the mid-gland. Midline needle position was confirmed in the sagittal and axial views to verify the tip was in the perirectal fat.  Small amounts of saline were injected to hydrodissect the space between the prostate and the anterior rectal wall.  Axial imaging was viewed to confirm the needle was in the correct location in the mid gland and centered.  Aspiration confirmed no intravascular  access.  The saline syringe was carefully disconnected maintaining the desired needle position and the hydrogel was attached to the needle.  Under ultrasound guidance in the sagittal view a smooth continuous injection was done over about 12 seconds delivering the hydrogel into the space between the prostate and rectal wall.  The needle was withdrawn.  The Foley was removed. The patient was prepped again and cystoscopy was performed which was noted to be normal. He was awakened taken to the recovery room in stable condition.  Complications: None  Blood loss: Minimal  Specimens: None  Drains: None   Disposition: Patient stable to PACU - discussed with Almyra Free over the phone - procedure, post-op care, follow-up.

## 2019-07-03 NOTE — H&P (Addendum)
H&P  Chief Complaint: Localized prostate cancer  History of Present Illness: Corey Carroll is a 69 year old male who had a PSA elevation to 5.7.  His prostate exam was normal but he had Gleason 4+4 = 8 on a prostate biopsy.  He began androgen deprivation on April 13, 2019.  He presents today for brachy boost in combination with 5 weeks of external beam radiation.  We will plan on long-term ADT.  He has been well without fever, dysuria or gross hematuria.  Prostate cancer characteristics: TNM stage: cT1c N0 M0  PSA: 5.87  Gleason score: 4+4=8 (Grade group 4)  Biopsy (01/21/19): 3/12 cores positive  Left: Benign  Right: R apex (30%, 4+4=8), R lateral apex (5%, 3+4=7), R lateral base (40%, 4+4=8)  Prostate volume: 25.1 cc    Past Medical History:  Diagnosis Date  . Colon cancer ([place])   . High cholesterol   . Hypertension   . Prostate cancer ([place])   . Tachycardia    Past Surgical History:  Procedure Laterality Date  . BACK SURGERY  1996   removed herniated disc at L5  . COLON RESECTION    . PROSTATE BIOPSY      Home Medications:  Medications Prior to Admission  Medication Sig Dispense Refill Last Dose  . Coenzyme Q10 (COQ10) 100 MG CAPS Take by mouth.   Past Month at Unknown time  . lisinopril (ZESTRIL) 5 MG tablet Take 5 mg by mouth daily.   07/02/2019 at 0600  . Multiple Vitamins-Minerals (CENTRUM ADULTS PO) Take by mouth.   Past Month at Unknown time  . simvastatin (ZOCOR) 20 MG tablet Take 20 mg by mouth daily. Every other day   07/02/2019 at 1500  . carvedilol (COREG) 6.25 MG tablet SMARTSIG:1 Tablet(s) By Mouth As Needed   More than a month at Unknown time  . Melatonin 3 MG TABS Take by mouth.   More than a month at Unknown time  . sildenafil (REVATIO) 20 MG tablet SMARTSIG:1 Tablet(s) By Mouth As Needed   More than a month at Unknown time   Allergies: No Known Allergies  Family History  Problem Relation Age of Onset  . Breast cancer Mother        tx by way of mastectomy   . Lung cancer Father        smoker but stopped at 9 and died at 96 of lung ca.  . Colon cancer Paternal Aunt   . Colon cancer Paternal Aunt    Social History:  reports that he has never smoked. He has never used smokeless tobacco. He reports current alcohol use of about 4.0 standard drinks of alcohol per week. He reports that he does not use drugs.  ROS: A complete review of systems was performed.  All systems are negative except for pertinent findings as noted. Review of Systems  Musculoskeletal: Positive for back pain.  All other systems reviewed and are negative.    Physical Exam:  Vital signs in last 24 hours: Temp:  [97.6 F (36.4 C)] 97.6 F (36.4 C) (04/09 0901) Pulse Rate:  [86] 86 (04/09 0901) Resp:  [16] 16 (04/09 0901) BP: (146)/(79) 146/79 (04/09 0901) SpO2:  [99 %] 99 % (04/09 0901) Weight:  [65.7 kg] 65.7 kg (04/09 0901) General:  Alert and oriented, No acute distress HEENT: Normocephalic, atraumatic Cardiovascular: Regular rate and rhythm Lungs: Regular rate and effort Abdomen: Soft, nontender, nondistended, no abdominal masses Back: No CVA tenderness Extremities: No edema Neurologic: Grossly intact  Laboratory  Data:  No results found for this or any previous visit (from the past 24 hour(s)). Recent Results (from the past 240 hour(s))  SARS CORONAVIRUS 2 (TAT 6-24 HRS) Nasopharyngeal Nasopharyngeal Swab     Status: None   Collection Time: 06/30/19 10:22 AM   Specimen: Nasopharyngeal Swab  Result Value Ref Range Status   SARS Coronavirus 2 NEGATIVE NEGATIVE Final    Comment: (NOTE) SARS-CoV-2 target nucleic acids are NOT DETECTED. The SARS-CoV-2 RNA is generally detectable in upper and lower respiratory specimens during the acute phase of infection. Negative results do not preclude SARS-CoV-2 infection, do not rule out co-infections with other pathogens, and should not be used as the sole basis for treatment or other patient management  decisions. Negative results must be combined with clinical observations, patient history, and epidemiological information. The expected result is Negative. Fact Sheet for Patients: SugarRoll.be Fact Sheet for Healthcare Providers: https://www.woods-mathews.com/ This test is not yet approved or cleared by the [place] FDA and  has been authorized for detection and/or diagnosis of SARS-CoV-2 by FDA under an Emergency Use Authorization (EUA). This EUA will remain  in effect (meaning this test can be used) for the duration of the COVID-19 declaration under Section 56 4(b)(1) of the Act, 21 U.S.C. section 360bbb-3(b)(1), unless the authorization is terminated or revoked sooner. Performed at [facility], [place] [address] [place], [place] [address]    Creatinine: Recent Labs    06/30/19 0957  CREATININE 0.71    Impression/Assessmentplan:  Prostate cancer-I discussed with the patient the nature, potential benefits, risks and alternatives to prostate brachytherapy and SpaceOAR gel placement, including side effects of the proposed treatment, the likelihood of the patient achieving the goals of the procedure, and any potential problems that might occur during the procedure or recuperation. All questions answered. Patient elects to proceed.    Festus Aloe 07/03/2019, 10:35 AM

## 2019-07-03 NOTE — Anesthesia Procedure Notes (Signed)
Procedure Name: LMA Insertion Date/Time: 07/03/2019 10:46 AM Performed by: Suan Halter, CRNA Pre-anesthesia Checklist: Patient identified, Emergency Drugs available, Suction available and Patient being monitored Patient Re-evaluated:Patient Re-evaluated prior to induction Oxygen Delivery Method: Circle system utilized Preoxygenation: Pre-oxygenation with 100% oxygen Induction Type: IV induction Ventilation: Mask ventilation without difficulty LMA: LMA inserted LMA Size: 4.0 Number of attempts: 1 Airway Equipment and Method: Bite block Placement Confirmation: positive ETCO2 Tube secured with: Tape Dental Injury: Teeth and Oropharynx as per pre-operative assessment

## 2019-07-03 NOTE — Transfer of Care (Signed)
Immediate Anesthesia Transfer of Care Note  Patient: Corey Carroll  Procedure(s) Performed: Procedure(s) (LRB): RADIOACTIVE SEED IMPLANT/BRACHYTHERAPY IMPLANT (N/A) SPACE OAR INSTILLATION (N/A) CYSTOSCOPY FLEXIBLE  Patient Location: PACU  Anesthesia Type: General  Level of Consciousness: awake, oriented, sedated and patient cooperative  Airway & Oxygen Therapy: Patient Spontanous Breathing and Patient connected to face mask oxygen  Post-op Assessment: Report given to PACU RN and Post -op Vital signs reviewed and stable  Post vital signs: Reviewed and stable  Complications: No apparent anesthesia complications Last Vitals:  Vitals Value Taken Time  BP 126/73 07/03/19 1200  Temp    Pulse 88 07/03/19 1202  Resp 14 07/03/19 1202  SpO2 100 % 07/03/19 1202  Vitals shown include unvalidated device data.  Last Pain:  Vitals:   07/03/19 0901  TempSrc: Oral  PainSc: 0-No pain      Patients Stated Pain Goal: 6 (07/03/19 0901)

## 2019-07-05 NOTE — Progress Notes (Signed)
  Radiation Oncology         [phone] ________________________________  Name: Corey Carroll MRN: [id number]  Date: 07/05/2019  DOB: [birth year]       Prostate Seed Implant  [name], Belenda Cruise, PA-C  No ref. provider found  DIAGNOSIS:  Oncology History  Malignant neoplasm of prostate ([place])  01/21/2019 Cancer Staging   Staging form: Prostate, AJCC 8th Edition - Clinical stage from 01/21/2019: Stage IIC (cT1c, cN0, cM0, PSA: 5.9, Grade Group: 4) - Signed by Freeman Caldron, PA-C on 06/04/2019   03/25/2019 Initial Diagnosis   Malignant neoplasm of prostate ([place])       ICD-10-CM   1. Malignant neoplasm of prostate [facility])  C61 Discharge patient    PROCEDURE: Insertion of radioactive I-125 seeds into the prostate gland.  RADIATION DOSE: 110 Gy, boost therapy.  TECHNIQUE: [name] was brought to the operating room with the urologist. He was placed in the dorsolithotomy position. He was catheterized and a rectal tube was inserted. The perineum was shaved, prepped and draped. The ultrasound probe was then introduced into the rectum to see the prostate gland.  TREATMENT DEVICE: A needle grid was attached to the ultrasound probe stand and anchor needles were placed.  3D PLANNING: The prostate was imaged in 3D using a sagittal sweep of the prostate probe. These images were transferred to the planning computer. There, the prostate, urethra and rectum were defined on each axial reconstructed image. Then, the software created an optimized 3D plan and a few seed positions were adjusted. The quality of the plan was reviewed using [facility] information for the target and the following two organs at risk:  Urethra and Rectum.  Then the accepted plan was printed and handed off to the radiation therapist.  Under my supervision, the custom loading of the seeds and spacers was carried out and loaded into sealed vicryl sleeves.  These pre-loaded needles were then placed into the needle  holder.Marland Kitchen  PROSTATE VOLUME STUDY:  Using transrectal ultrasound the volume of the prostate was verified to be 21.4 cc.  SPECIAL TREATMENT PROCEDURE/SUPERVISION AND HANDLING: The pre-loaded needles were then delivered under sagittal guidance. A total of 25 needles were used to deposit 62 seeds in the prostate gland. The individual seed activity was 0.273 mCi.  SpaceOAR:  Yes  COMPLEX SIMULATION: At the end of the procedure, an anterior radiograph of the pelvis was obtained to document seed positioning and count. Cystoscopy was performed to check the urethra and bladder.  MICRODOSIMETRY: At the end of the procedure, the patient was emitting 0.123 mR/hr at 1 meter. Accordingly, he was considered safe for hospital discharge.  PLAN: The patient will return to the radiation oncology clinic for post implant CT dosimetry in three weeks.   ________________________________  Sheral Apley Tammi Klippel, M.D.

## 2019-07-14 ENCOUNTER — Telehealth: Payer: Self-pay | Admitting: *Deleted

## 2019-07-14 NOTE — Telephone Encounter (Signed)
Called patient to inform of sim time being altered on  07-16-19, lvm for a return call

## 2019-07-15 ENCOUNTER — Telehealth: Payer: Self-pay | Admitting: *Deleted

## 2019-07-15 NOTE — Telephone Encounter (Signed)
CALLED PATIENT TO INFORM THAT SIM HAS BEEN MOVED TO 07-16-19 - ARRIVAL TIME- 9:45 AM @ [place], SPOKE WITH PATIENT AND HE IS AWARE OF THE CHANGE AND IS GOOD WITH THIS CHANGE

## 2019-07-15 NOTE — Telephone Encounter (Signed)
CALLED PATIENT TO LET HIM KNOW THAT THEY WOULD BE ABLE TO WORK HIM IN FOR MRI ON 07-16-19 AFTER SIM, SPOKE WITH PATIENT AND HE IS AWARE OF THIS

## 2019-07-16 ENCOUNTER — Ambulatory Visit
Admission: RE | Admit: 2019-07-16 | Discharge: 2019-07-16 | Disposition: A | Discharge status: Home / Self Care | Payer: Medicare Other | Source: Ambulatory Visit | Attending: Radiation Oncology | Admitting: Radiation Oncology

## 2019-07-16 ENCOUNTER — Ambulatory Visit: Payer: Medicare Other | Admitting: Urology

## 2019-07-16 ENCOUNTER — Other Ambulatory Visit: Payer: Self-pay

## 2019-07-16 ENCOUNTER — Ambulatory Visit (HOSPITAL_COMMUNITY)
Admission: RE | Admit: 2019-07-16 | Discharge: 2019-07-16 | Disposition: A | Discharge status: Home / Self Care | Payer: Medicare Other | Source: Ambulatory Visit | Attending: Urology | Admitting: Urology

## 2019-07-16 ENCOUNTER — Ambulatory Visit: Admit: 2019-07-16 | Payer: Medicare Other | Admitting: Urology

## 2019-07-16 DIAGNOSIS — C61 Malignant neoplasm of prostate: Secondary | ICD-10-CM | POA: Insufficient documentation

## 2019-07-20 NOTE — Progress Notes (Signed)
  Radiation Oncology         [phone] ________________________________  Name: Corey Carroll MRN: [id number]  Date: 07/16/2019  DOB: Aug 01, 1950  SIMULATION AND TREATMENT PLANNING NOTE    ICD-10-CM   1. Malignant neoplasm of prostate ([place])  C61     DIAGNOSIS:  69 y.o. gentleman with Stage T1c adenocarcinoma of the prostate with Gleason score of 4+4, and PSA of 5.87   NARRATIVE:  The patient was brought to the [facility].  Identity was confirmed.  All relevant records and images related to the planned course of therapy were reviewed.  The patient freely provided informed written consent to proceed with treatment after reviewing the details related to the planned course of therapy. The consent form was witnessed and verified by the simulation staff.  Then, the patient was set-up in a stable reproducible supine position for radiation therapy.  A vacuum lock pillow device was custom fabricated to position his legs in a reproducible immobilized position.  Then, I performed a urethrogram under sterile conditions to identify the prostatic apex.  CT images were obtained.  Surface markings were placed.  The CT images were loaded into the planning software.  Then the prostate target and avoidance structures including the rectum, bladder, bowel and hips were contoured.  Treatment planning then occurred.  The radiation prescription was entered and confirmed.  A total of one complex treatment devices were fabricated. I have requested : Intensity Modulated Radiotherapy (IMRT) is medically necessary for this case for the following reason:  Rectal sparing.Marland Kitchen  PLAN:  The patient will receive 45 Gy in 25 fractions of 1.8 Gy, to supplement an up-front prostate seed implant boost of 110 Gy to achieve a total nominal dose of 165 Gy.  ________________________________  Sheral Apley Tammi Klippel, M.D.

## 2019-07-20 NOTE — Progress Notes (Signed)
  Radiation Oncology         [phone] ________________________________  Name: Corey Carroll MRN: [id number]  Date: 07/16/2019  DOB: 1951/03/15  COMPLEX SIMULATION NOTE  NARRATIVE:  The patient was brought to the [facility] today following prostate seed implantation approximately one month ago.  Identity was confirmed.  All relevant records and images related to the planned course of therapy were reviewed.  Then, the patient was set-up supine.  CT images were obtained.  The CT images were loaded into the planning software.  Then the prostate and rectum were contoured.  Treatment planning then occurred.  The implanted iodine 125 seeds were identified by the physics staff for projection of radiation distribution  I have requested : 3D Simulation  I have requested a DVH of the following structures: Prostate and rectum.    ________________________________  Sheral Apley Tammi Klippel, M.D.

## 2019-07-21 DIAGNOSIS — C61 Malignant neoplasm of prostate: Secondary | ICD-10-CM | POA: Diagnosis not present

## 2019-07-27 ENCOUNTER — Ambulatory Visit
Admission: RE | Admit: 2019-07-27 | Discharge: 2019-07-27 | Disposition: A | Discharge status: Home / Self Care | Payer: Medicare Other | Source: Ambulatory Visit | Attending: Radiation Oncology | Admitting: Radiation Oncology

## 2019-07-27 ENCOUNTER — Other Ambulatory Visit: Payer: Self-pay

## 2019-07-27 DIAGNOSIS — C61 Malignant neoplasm of prostate: Secondary | ICD-10-CM | POA: Insufficient documentation

## 2019-07-28 ENCOUNTER — Ambulatory Visit
Admission: RE | Admit: 2019-07-28 | Discharge: 2019-07-28 | Disposition: A | Discharge status: Home / Self Care | Payer: Medicare Other | Source: Ambulatory Visit | Attending: Radiation Oncology | Admitting: Radiation Oncology

## 2019-07-28 ENCOUNTER — Other Ambulatory Visit: Payer: Self-pay

## 2019-07-28 DIAGNOSIS — C61 Malignant neoplasm of prostate: Secondary | ICD-10-CM | POA: Diagnosis not present

## 2019-07-29 ENCOUNTER — Other Ambulatory Visit: Payer: Self-pay

## 2019-07-29 ENCOUNTER — Ambulatory Visit
Admission: RE | Admit: 2019-07-29 | Discharge: 2019-07-29 | Disposition: A | Discharge status: Home / Self Care | Payer: Medicare Other | Source: Ambulatory Visit | Attending: Radiation Oncology | Admitting: Radiation Oncology

## 2019-07-29 DIAGNOSIS — C61 Malignant neoplasm of prostate: Secondary | ICD-10-CM | POA: Diagnosis not present

## 2019-07-30 ENCOUNTER — Other Ambulatory Visit: Payer: Self-pay

## 2019-07-30 ENCOUNTER — Ambulatory Visit
Admission: RE | Admit: 2019-07-30 | Discharge: 2019-07-30 | Disposition: A | Discharge status: Home / Self Care | Payer: Medicare Other | Source: Ambulatory Visit | Attending: Radiation Oncology | Admitting: Radiation Oncology

## 2019-07-30 DIAGNOSIS — C61 Malignant neoplasm of prostate: Secondary | ICD-10-CM | POA: Diagnosis not present

## 2019-07-31 ENCOUNTER — Other Ambulatory Visit: Payer: Self-pay | Admitting: Radiation Oncology

## 2019-07-31 ENCOUNTER — Ambulatory Visit
Admission: RE | Admit: 2019-07-31 | Discharge: 2019-07-31 | Disposition: A | Discharge status: Home / Self Care | Payer: Medicare Other | Source: Ambulatory Visit | Attending: Radiation Oncology | Admitting: Radiation Oncology

## 2019-07-31 ENCOUNTER — Other Ambulatory Visit: Payer: Self-pay

## 2019-07-31 DIAGNOSIS — C61 Malignant neoplasm of prostate: Secondary | ICD-10-CM | POA: Diagnosis not present

## 2019-07-31 MED ORDER — PROCHLORPERAZINE MALEATE 10 MG PO TABS: 10.0000 mg | ORAL_TABLET | Freq: Four times a day (QID) | ORAL | 1 refills | Status: AC | PRN

## 2019-08-03 ENCOUNTER — Other Ambulatory Visit: Payer: Self-pay

## 2019-08-03 ENCOUNTER — Ambulatory Visit
Admission: RE | Admit: 2019-08-03 | Discharge: 2019-08-03 | Disposition: A | Discharge status: Home / Self Care | Payer: Medicare Other | Source: Ambulatory Visit | Attending: Radiation Oncology | Admitting: Radiation Oncology

## 2019-08-03 DIAGNOSIS — C61 Malignant neoplasm of prostate: Secondary | ICD-10-CM | POA: Diagnosis not present

## 2019-08-04 ENCOUNTER — Other Ambulatory Visit: Payer: Self-pay

## 2019-08-04 ENCOUNTER — Ambulatory Visit
Admission: RE | Admit: 2019-08-04 | Discharge: 2019-08-04 | Disposition: A | Discharge status: Home / Self Care | Payer: Medicare Other | Source: Ambulatory Visit | Attending: Radiation Oncology | Admitting: Radiation Oncology

## 2019-08-04 DIAGNOSIS — C61 Malignant neoplasm of prostate: Secondary | ICD-10-CM | POA: Diagnosis not present

## 2019-08-05 ENCOUNTER — Ambulatory Visit
Admission: RE | Admit: 2019-08-05 | Discharge: 2019-08-05 | Disposition: A | Discharge status: Home / Self Care | Payer: Medicare Other | Source: Ambulatory Visit | Attending: Radiation Oncology | Admitting: Radiation Oncology

## 2019-08-05 ENCOUNTER — Other Ambulatory Visit: Payer: Self-pay

## 2019-08-05 DIAGNOSIS — C61 Malignant neoplasm of prostate: Secondary | ICD-10-CM | POA: Diagnosis not present

## 2019-08-06 ENCOUNTER — Other Ambulatory Visit: Payer: Self-pay

## 2019-08-06 ENCOUNTER — Ambulatory Visit
Admission: RE | Admit: 2019-08-06 | Discharge: 2019-08-06 | Disposition: A | Discharge status: Home / Self Care | Payer: Medicare Other | Source: Ambulatory Visit | Attending: Radiation Oncology | Admitting: Radiation Oncology

## 2019-08-06 DIAGNOSIS — C61 Malignant neoplasm of prostate: Secondary | ICD-10-CM | POA: Diagnosis not present

## 2019-08-07 ENCOUNTER — Ambulatory Visit
Admission: RE | Admit: 2019-08-07 | Discharge: 2019-08-07 | Disposition: A | Discharge status: Home / Self Care | Payer: Medicare Other | Source: Ambulatory Visit | Attending: Radiation Oncology | Admitting: Radiation Oncology

## 2019-08-07 ENCOUNTER — Other Ambulatory Visit: Payer: Self-pay

## 2019-08-07 DIAGNOSIS — C61 Malignant neoplasm of prostate: Secondary | ICD-10-CM | POA: Diagnosis not present

## 2019-08-10 ENCOUNTER — Other Ambulatory Visit: Payer: Self-pay

## 2019-08-10 ENCOUNTER — Ambulatory Visit
Admission: RE | Admit: 2019-08-10 | Discharge: 2019-08-10 | Disposition: A | Discharge status: Home / Self Care | Payer: Medicare Other | Source: Ambulatory Visit | Attending: Radiation Oncology | Admitting: Radiation Oncology

## 2019-08-10 DIAGNOSIS — C61 Malignant neoplasm of prostate: Secondary | ICD-10-CM | POA: Diagnosis not present

## 2019-08-11 ENCOUNTER — Other Ambulatory Visit: Payer: Self-pay

## 2019-08-11 ENCOUNTER — Ambulatory Visit
Admission: RE | Admit: 2019-08-11 | Discharge: 2019-08-11 | Disposition: A | Discharge status: Home / Self Care | Payer: Medicare Other | Source: Ambulatory Visit | Attending: Radiation Oncology | Admitting: Radiation Oncology

## 2019-08-11 DIAGNOSIS — C61 Malignant neoplasm of prostate: Secondary | ICD-10-CM | POA: Diagnosis not present

## 2019-08-12 ENCOUNTER — Other Ambulatory Visit: Payer: Self-pay

## 2019-08-12 ENCOUNTER — Ambulatory Visit
Admission: RE | Admit: 2019-08-12 | Discharge: 2019-08-12 | Disposition: A | Discharge status: Home / Self Care | Payer: Medicare Other | Source: Ambulatory Visit | Attending: Radiation Oncology | Admitting: Radiation Oncology

## 2019-08-12 DIAGNOSIS — C61 Malignant neoplasm of prostate: Secondary | ICD-10-CM | POA: Diagnosis not present

## 2019-08-13 ENCOUNTER — Ambulatory Visit
Admission: RE | Admit: 2019-08-13 | Discharge: 2019-08-13 | Disposition: A | Discharge status: Home / Self Care | Payer: Medicare Other | Source: Ambulatory Visit | Attending: Radiation Oncology | Admitting: Radiation Oncology

## 2019-08-13 ENCOUNTER — Other Ambulatory Visit: Payer: Self-pay

## 2019-08-13 DIAGNOSIS — C61 Malignant neoplasm of prostate: Secondary | ICD-10-CM | POA: Diagnosis not present

## 2019-08-14 ENCOUNTER — Other Ambulatory Visit: Payer: Self-pay

## 2019-08-14 ENCOUNTER — Ambulatory Visit
Admission: RE | Admit: 2019-08-14 | Discharge: 2019-08-14 | Disposition: A | Discharge status: Home / Self Care | Payer: Medicare Other | Source: Ambulatory Visit | Attending: Radiation Oncology | Admitting: Radiation Oncology

## 2019-08-14 DIAGNOSIS — C61 Malignant neoplasm of prostate: Secondary | ICD-10-CM | POA: Diagnosis not present

## 2019-08-17 ENCOUNTER — Other Ambulatory Visit: Payer: Self-pay

## 2019-08-17 ENCOUNTER — Ambulatory Visit
Admission: RE | Admit: 2019-08-17 | Discharge: 2019-08-17 | Disposition: A | Discharge status: Home / Self Care | Payer: Medicare Other | Source: Ambulatory Visit | Attending: Radiation Oncology | Admitting: Radiation Oncology

## 2019-08-17 DIAGNOSIS — C61 Malignant neoplasm of prostate: Secondary | ICD-10-CM | POA: Diagnosis not present

## 2019-08-18 ENCOUNTER — Other Ambulatory Visit: Payer: Self-pay

## 2019-08-18 ENCOUNTER — Ambulatory Visit
Admission: RE | Admit: 2019-08-18 | Discharge: 2019-08-18 | Disposition: A | Discharge status: Home / Self Care | Payer: Medicare Other | Source: Ambulatory Visit | Attending: Radiation Oncology | Admitting: Radiation Oncology

## 2019-08-18 DIAGNOSIS — C61 Malignant neoplasm of prostate: Secondary | ICD-10-CM | POA: Diagnosis not present

## 2019-08-19 ENCOUNTER — Ambulatory Visit
Admission: RE | Admit: 2019-08-19 | Discharge: 2019-08-19 | Disposition: A | Discharge status: Home / Self Care | Payer: Medicare Other | Source: Ambulatory Visit | Attending: Radiation Oncology | Admitting: Radiation Oncology

## 2019-08-19 ENCOUNTER — Other Ambulatory Visit: Payer: Self-pay

## 2019-08-19 DIAGNOSIS — C61 Malignant neoplasm of prostate: Secondary | ICD-10-CM | POA: Diagnosis not present

## 2019-08-20 ENCOUNTER — Ambulatory Visit
Admission: RE | Admit: 2019-08-20 | Discharge: 2019-08-20 | Disposition: A | Discharge status: Home / Self Care | Payer: Medicare Other | Source: Ambulatory Visit | Attending: Radiation Oncology | Admitting: Radiation Oncology

## 2019-08-20 ENCOUNTER — Other Ambulatory Visit: Payer: Self-pay

## 2019-08-20 DIAGNOSIS — C61 Malignant neoplasm of prostate: Secondary | ICD-10-CM | POA: Diagnosis not present

## 2019-08-21 ENCOUNTER — Other Ambulatory Visit: Payer: Self-pay

## 2019-08-21 ENCOUNTER — Ambulatory Visit
Admission: RE | Admit: 2019-08-21 | Discharge: 2019-08-21 | Disposition: A | Discharge status: Home / Self Care | Payer: Medicare Other | Source: Ambulatory Visit | Attending: Radiation Oncology | Admitting: Radiation Oncology

## 2019-08-21 DIAGNOSIS — C61 Malignant neoplasm of prostate: Secondary | ICD-10-CM | POA: Diagnosis not present

## 2019-08-25 ENCOUNTER — Other Ambulatory Visit: Payer: Self-pay

## 2019-08-25 ENCOUNTER — Ambulatory Visit
Admission: RE | Admit: 2019-08-25 | Discharge: 2019-08-25 | Disposition: A | Discharge status: Home / Self Care | Payer: Medicare Other | Source: Ambulatory Visit | Attending: Radiation Oncology | Admitting: Radiation Oncology

## 2019-08-25 DIAGNOSIS — C61 Malignant neoplasm of prostate: Secondary | ICD-10-CM | POA: Insufficient documentation

## 2019-08-26 ENCOUNTER — Other Ambulatory Visit: Payer: Self-pay

## 2019-08-26 ENCOUNTER — Ambulatory Visit
Admission: RE | Admit: 2019-08-26 | Discharge: 2019-08-26 | Disposition: A | Discharge status: Home / Self Care | Payer: Medicare Other | Source: Ambulatory Visit | Attending: Radiation Oncology | Admitting: Radiation Oncology

## 2019-08-26 DIAGNOSIS — C61 Malignant neoplasm of prostate: Secondary | ICD-10-CM | POA: Diagnosis not present

## 2019-08-27 ENCOUNTER — Ambulatory Visit
Admission: RE | Admit: 2019-08-27 | Discharge: 2019-08-27 | Disposition: A | Discharge status: Home / Self Care | Payer: Medicare Other | Source: Ambulatory Visit | Attending: Radiation Oncology | Admitting: Radiation Oncology

## 2019-08-27 ENCOUNTER — Other Ambulatory Visit: Payer: Self-pay

## 2019-08-27 DIAGNOSIS — C61 Malignant neoplasm of prostate: Secondary | ICD-10-CM | POA: Diagnosis not present

## 2019-08-28 ENCOUNTER — Other Ambulatory Visit: Payer: Self-pay

## 2019-08-28 ENCOUNTER — Ambulatory Visit
Admission: RE | Admit: 2019-08-28 | Discharge: 2019-08-28 | Disposition: A | Discharge status: Home / Self Care | Payer: Medicare Other | Source: Ambulatory Visit | Attending: Radiation Oncology | Admitting: Radiation Oncology

## 2019-08-28 DIAGNOSIS — C61 Malignant neoplasm of prostate: Secondary | ICD-10-CM | POA: Diagnosis not present

## 2019-08-31 ENCOUNTER — Encounter: Payer: Self-pay | Admitting: Radiation Oncology

## 2019-08-31 ENCOUNTER — Ambulatory Visit
Admission: RE | Admit: 2019-08-31 | Discharge: 2019-08-31 | Disposition: A | Discharge status: Home / Self Care | Payer: Medicare Other | Source: Ambulatory Visit | Attending: Radiation Oncology | Admitting: Radiation Oncology

## 2019-08-31 ENCOUNTER — Other Ambulatory Visit: Payer: Self-pay

## 2019-08-31 DIAGNOSIS — C61 Malignant neoplasm of prostate: Secondary | ICD-10-CM | POA: Diagnosis not present

## 2019-09-04 ENCOUNTER — Encounter: Payer: Self-pay | Admitting: Radiation Oncology

## 2019-09-04 DIAGNOSIS — C61 Malignant neoplasm of prostate: Secondary | ICD-10-CM | POA: Diagnosis not present

## 2019-10-18 NOTE — Progress Notes (Signed)
  Radiation Oncology         [phone] ________________________________  Name: Corey Carroll MRN: [id number]  Date: 09/04/2019  DOB: Oct 26, 1950  3D Planning Note   Prostate Brachytherapy Post-Implant Dosimetry  Diagnosis: 69 y.o. gentleman with Stage T1c adenocarcinoma of the prostate with Gleason score of 4+4, and PSA of 5.87  Narrative: On a previous date, Corey Carroll returned following prostate seed implantation for post implant planning. He underwent CT scan complex simulation to delineate the three-dimensional structures of the pelvis and demonstrate the radiation distribution.  Since that time, the seed localization, and complex isodose planning with dose volume histograms have now been completed.  Results:   Prostate Coverage - The dose of radiation delivered to the 90% or more of the prostate gland (D90) was 104.2% of the prescription dose. This exceeds our goal of greater than 90%. Rectal Sparing - The volume of rectal tissue receiving the prescription dose or higher was 0.0 cc. This falls under our thresholds tolerance of 1.0 cc.  Impression: The prostate seed implant appears to show adequate target coverage and appropriate rectal sparing.  Plan:  The patient will continue to follow with urology for ongoing PSA determinations. I would anticipate a high likelihood for local tumor control with minimal risk for rectal morbidity.  ________________________________  Sheral Apley Tammi Klippel, M.D.

## 2020-03-08 NOTE — Progress Notes (Signed)
  Radiation Oncology         [phone] ________________________________  Name: Corey Carroll MRN: [id number]  Date: 08/31/2019  DOB: [birth year]  End of Treatment Note  Diagnosis:   69 y.o. gentleman with Stage T1c adenocarcinoma of the prostate with Gleason score of 4+4, and PSA of 5.87      Indication for treatment:  Curative, Definitive Radiotherapy       Radiation treatment dates:   07/03/19 seed boost, then, IMRT 07/27/19-08/31/19  Site/dose:  1. Radioactive seeds were implanted into the prostate for a total of 110 Gy. 2. The prostate, seminal vesicles, and pelvic lymph nodes were boosted with 45 Gy in 25 fractions of 1.8 Gy, for a total dose of 155 Gy  Beams/energy:  1. The radioactive seeds were delivered under sagittal guidance with 3D imaging. 2. The prostate, seminal vesicles, and pelvic lymph nodes were initially treated using helical intensity modulated radiotherapy delivering 6 megavolt photons. Image guidance was performed with megavoltage CT studies prior to each fraction. He was immobilized with a body fix lower extremity mold.   Narrative: The patient tolerated radiation treatment relatively well. The patient experienced some minor urinary irritation and modest fatigue.    Plan: The patient has completed radiation treatment. He will return to radiation oncology clinic for routine followup in one month. I advised him to call or return sooner if he has any questions or concerns related to his recovery or treatment. ________________________________  Sheral Apley. Tammi Klippel, M.D.

## 2021-08-16 IMAGING — NM NM BONE WHOLE BODY
2 series · 2 of 2 positions shown · non-contrast
Comparison: None

Correlation: CT abdomen and pelvis 02/11/2019

CLINICAL DATA: Prostate cancer, PSA 5.87 on 12/28/2018

EXAM:
NUCLEAR MEDICINE WHOLE BODY BONE SCAN
TECHNIQUE: Whole body anterior and posterior images were obtained approximately
3 hours after intravenous injection of radiopharmaceutical.
RADIOPHARMACEUTICALS:  21.5 mCi Kechnetium-MMm MDP IV

[Series 1: whole body · 2.66mm/px · 1 of 1 slices shown (1 of 2)]
[im 1/1]
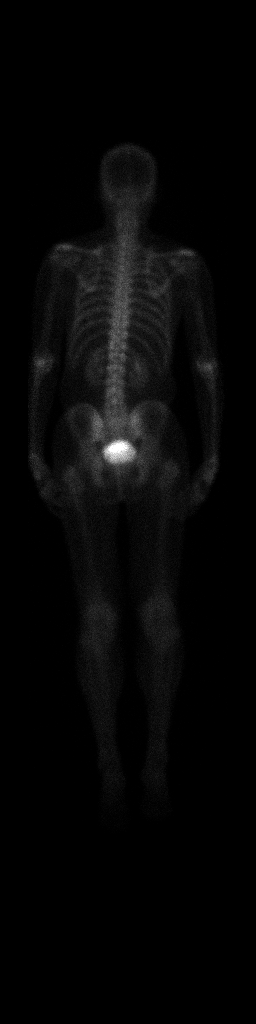

[Series 1: whole body · 2.66mm/px · 1 of 1 slices shown (2 of 2)]
[im 1/1]
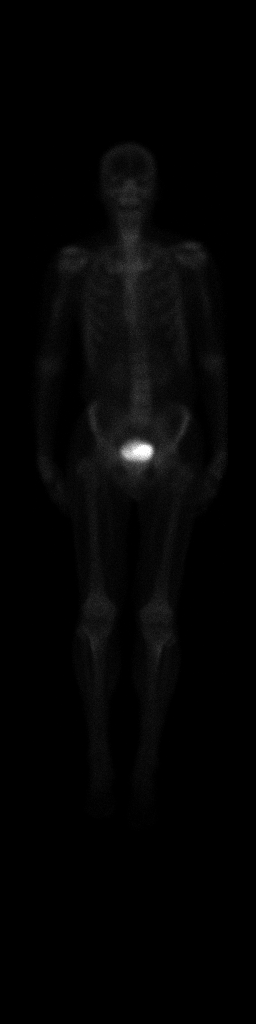

[2 of 2 positions shown; findings below may reference images not displayed]

FINDINGS: Minimal uptake at scattered joints typically degenerative.

No foci of abnormal osseous tracer accumulation are definitely
visualized to suggest osseous metastatic disease.

Expected urinary tract and soft tissue distribution of tracer.
IMPRESSION: No definite scintigraphic evidence of osseous metastatic disease.

## 2021-12-07 IMAGING — DX DG CHEST 2V
2 series · 2 of 2 positions shown · non-contrast
Comparison: None.

CLINICAL DATA: Pre-op respiratory exam for prostate carcinoma.

EXAM:
CHEST - 2 VIEW

[chest pa]
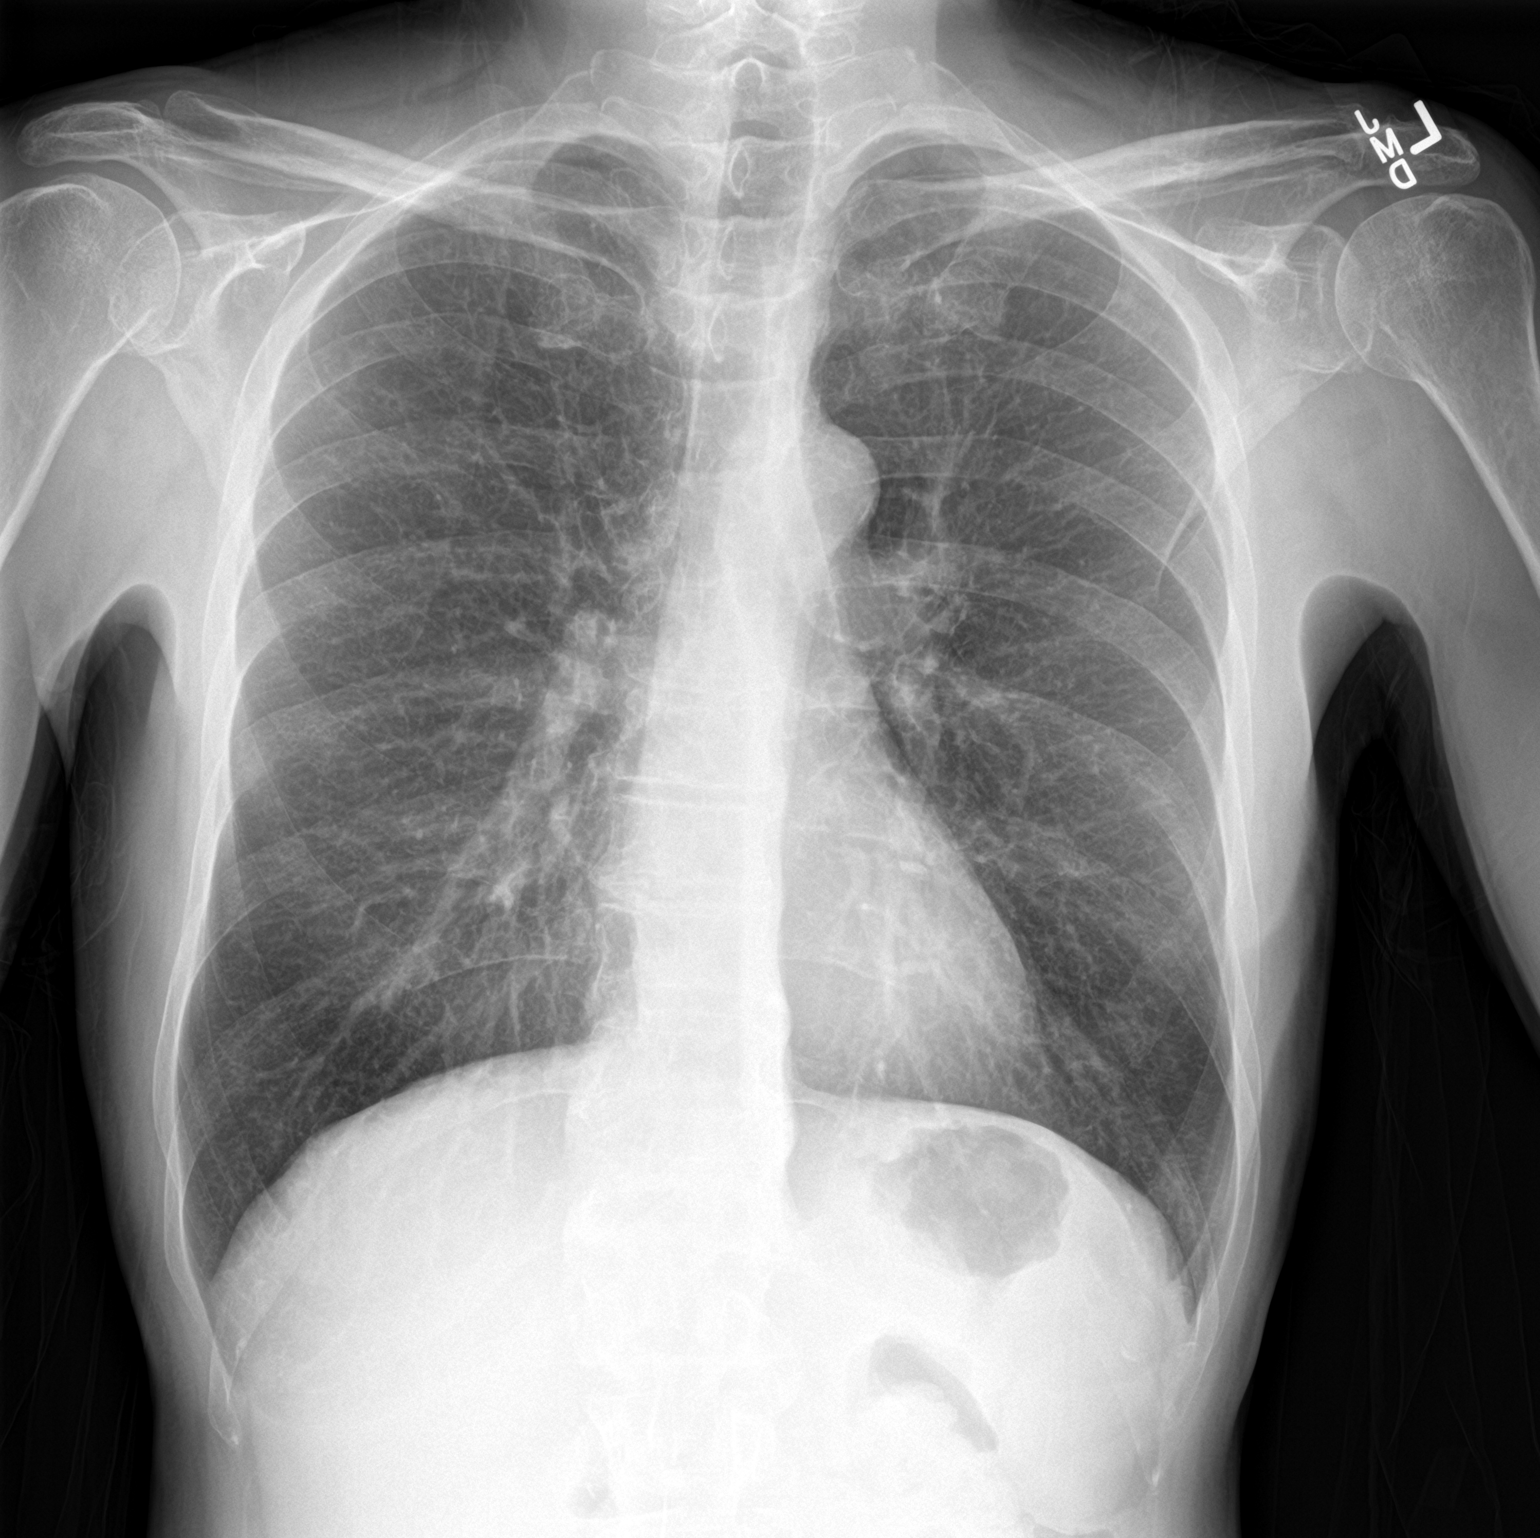

[chest lat]
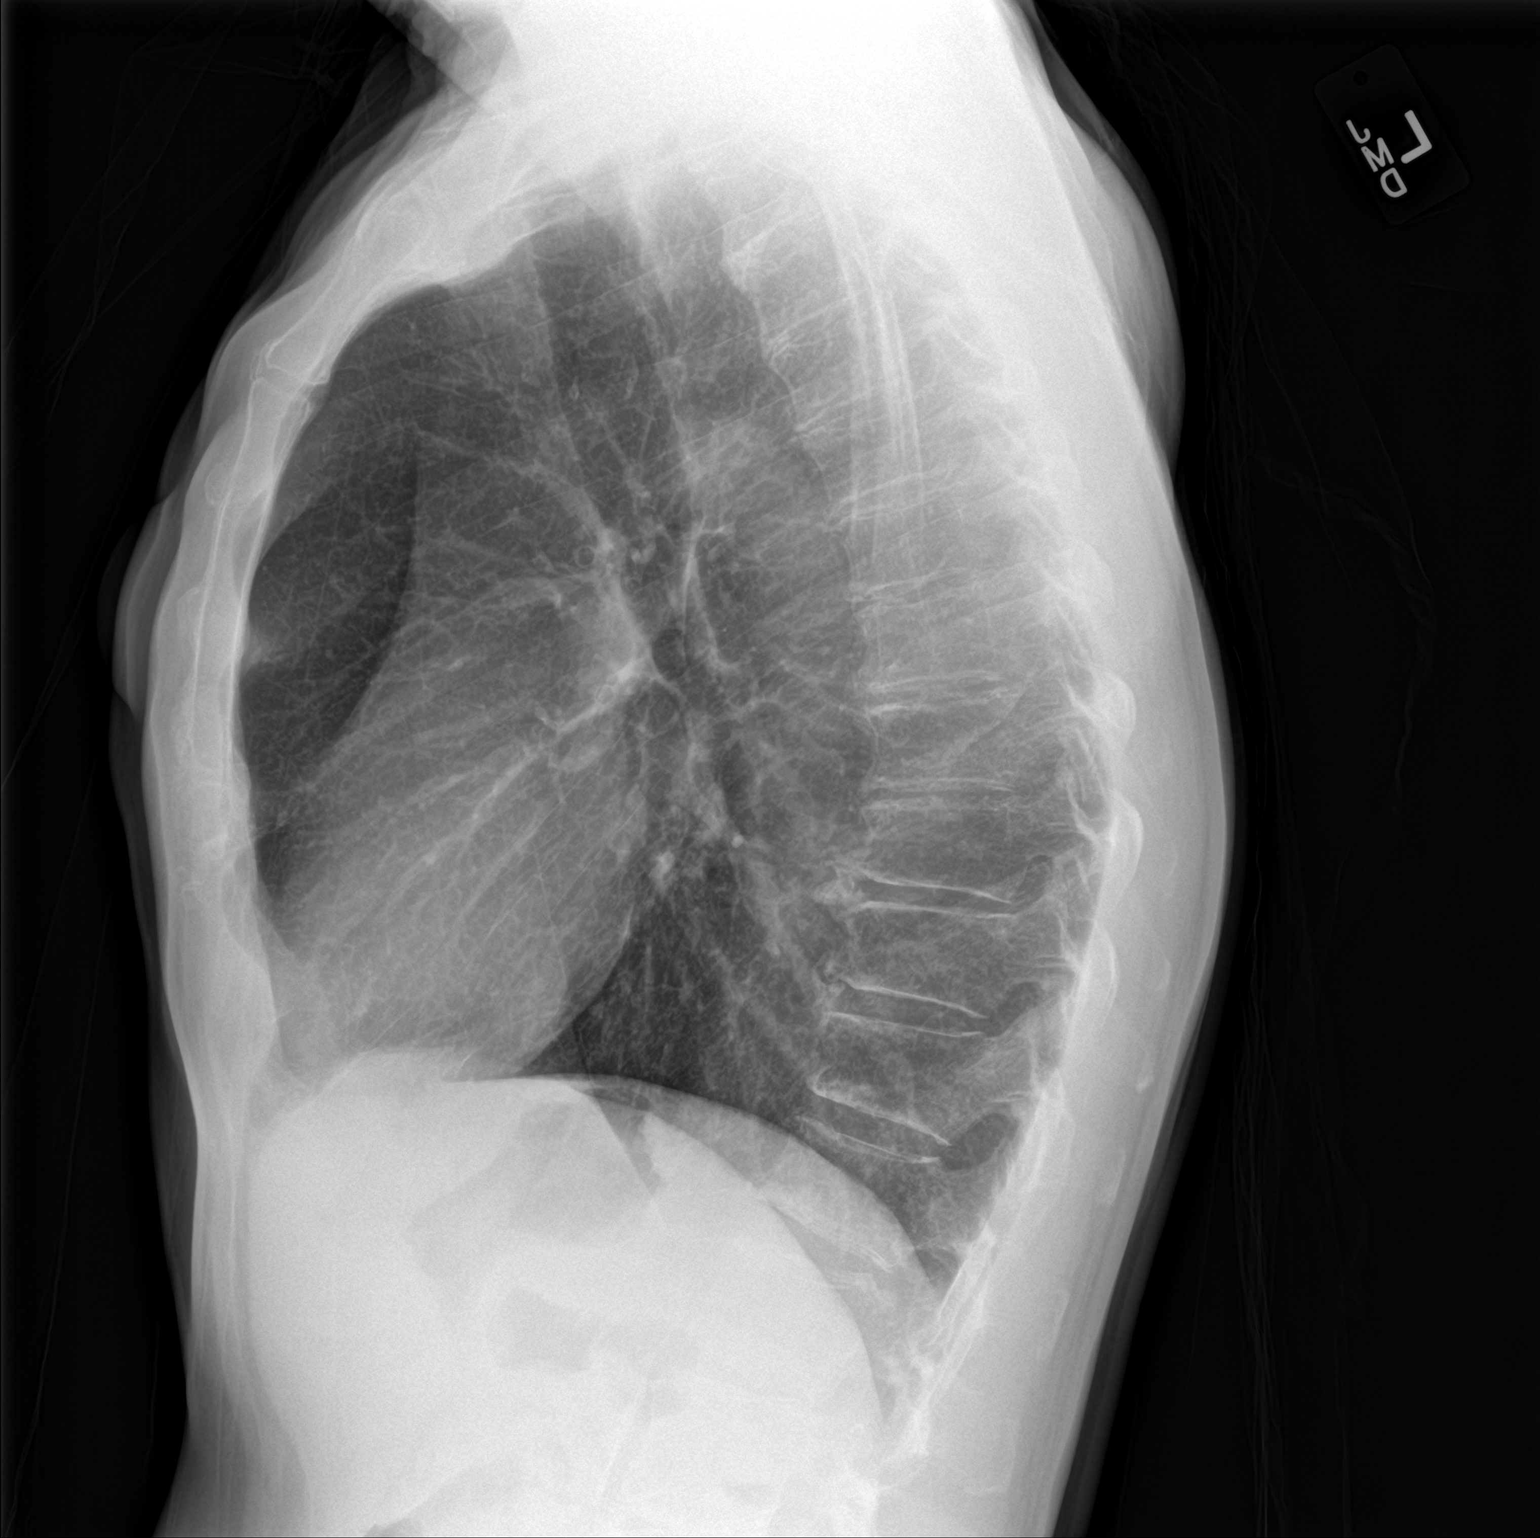

[2 of 2 positions shown; findings below may reference images not displayed]

FINDINGS: The heart size and mediastinal contours are within normal limits.
Pulmonary hyperinflation is consistent with COPD. Both lungs are
clear. The visualized skeletal structures are unremarkable.
IMPRESSION: COPD. No active disease.
